# Patient Record
Sex: Male | Born: 1995 | Race: Black or African American | Hispanic: No | Marital: Single | Smoking: Current every day smoker
Health system: Southern US, Community
[De-identification: ages and names within clinical notes are randomized; demographics above are authoritative.]

---
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
## 2017-12-14 ENCOUNTER — Emergency Department (HOSPITAL_COMMUNITY)
Admission: EM | Admit: 2017-12-14 | Discharge: 2017-12-14 | Disposition: A | Discharge status: Home / Self Care | Payer: Medicaid Other | Attending: Emergency Medicine | Admitting: Emergency Medicine

## 2017-12-14 ENCOUNTER — Encounter (HOSPITAL_COMMUNITY): Payer: Self-pay

## 2017-12-14 ENCOUNTER — Emergency Department (HOSPITAL_COMMUNITY): Payer: Medicaid Other

## 2017-12-14 DIAGNOSIS — F17200 Nicotine dependence, unspecified, uncomplicated: Secondary | ICD-10-CM | POA: Insufficient documentation

## 2017-12-14 DIAGNOSIS — R072 Precordial pain: Secondary | ICD-10-CM | POA: Insufficient documentation

## 2017-12-14 DIAGNOSIS — K219 Gastro-esophageal reflux disease without esophagitis: Secondary | ICD-10-CM | POA: Insufficient documentation

## 2017-12-14 LAB — BASIC METABOLIC PANEL
ANION GAP: 10 (ref 5–15)
BUN: 14 mg/dL (ref 6–20)
CALCIUM: 9.1 mg/dL (ref 8.9–10.3)
CO2: 26 mmol/L (ref 22–32)
Chloride: 104 mmol/L (ref 101–111)
Creatinine, Ser: 1.21 mg/dL (ref 0.61–1.24)
GFR calc Af Amer: >60 mL/min (ref >60)
Glucose, Bld: 116 mg/dL — ABNORMAL HIGH (ref 65–99)
POTASSIUM: 3.5 mmol/L (ref 3.5–5.1)
SODIUM: 140 mmol/L (ref 135–145)

## 2017-12-14 LAB — CBC
HEMATOCRIT: 42.2 % (ref 39.0–52.0)
HEMOGLOBIN: 13 g/dL (ref 13.0–17.0)
MCH: 26.2 pg (ref 26.0–34.0)
MCHC: 30.8 g/dL (ref 30.0–36.0)
MCV: 84.9 fL (ref 78.0–100.0)
Platelets: 289 10*3/uL (ref 150–400)
RBC: 4.97 MIL/uL (ref 4.22–5.81)
RDW: 13.3 % (ref 11.5–15.5)
WBC: 8.3 10*3/uL (ref 4.0–10.5)

## 2017-12-14 LAB — I-STAT TROPONIN, ED: TROPONIN I, POC: 0 ng/mL (ref 0.00–0.08)

## 2017-12-14 MED ORDER — ALUM & MAG HYDROXIDE-SIMETH 200-200-20 MG/5ML PO SUSP
30.0000 mL | Freq: Once | ORAL | Status: AC
  Administered 2017-12-14: 30 mL via ORAL
  Filled 2017-12-14: qty 30

## 2017-12-14 NOTE — ED Notes (Signed)
Patient presents to ed c/o chest pain  Onset earlier tonight  Worse why lying down states pain is worse with inspiration , states he feels like he can't get a deep enough breath in. C/o occ. Cough non-productive.

## 2017-12-14 NOTE — ED Triage Notes (Signed)
Pt reports that CP has been on and off all night, feels like his heart is racing with SOB, denies n/v or radiation

## 2017-12-14 NOTE — ED Provider Notes (Signed)
[facility] EMERGENCY [facility]PARTMENT Provider Note   CSN: [id number] Arrival date & time: 12/14/17  0305     History   Chief Complaint Chief Complaint  Patient presents with  . Chest Pain    HPI Gregory Ross is a 22 y.o. male.  HPI Patient is a healthy 22 year old male presents the emergency department after developing some anterior chest discomfort tonight after laying flat to go to bed.  He denies shortness of breath.  No fevers or chills.  No history of DVT or pulmonary embolism.  No family history of venous thromboembolic disease.  His pain is constant and is worse with deep breathing and laying flat.  Symptoms are mild in severity.  No medications attempted prior to arrival.  Just prior to lying down he did eat pizza and spicy blue cheese wings   History reviewed. No pertinent past medical history.  There are no active problems to display for this patient.   History reviewed. No pertinent surgical history.      Home Medications    Prior to Admission medications   Not on File    Family History No family history on file.  Social History Social History   Tobacco Use  . Smoking status: Current Every Day Smoker  . Smokeless tobacco: Never Used  Substance Use Topics  . Alcohol use: Yes  . Drug use: No     Allergies   Patient has no known allergies.   Review of Systems Review of Systems  All other systems reviewed and are negative.    Physical Exam Updated Vital Signs BP 121/70 (BP Location: Right Arm)   Pulse 70   Temp 98.2 F (36.8 C) (Oral)   Resp 16   Ht 5\' 6"  (1.676 m)   Wt 72.6 kg (160 lb)   SpO2 99%   BMI 25.82 kg/m   Physical Exam  Constitutional: He is oriented to person, place, and time.  HENT:  Head: Normocephalic and atraumatic.  Eyes: EOM are normal.  Neck: Normal range of motion.  Cardiovascular: Normal rate, regular rhythm and intact distal pulses.  Pulmonary/Chest: Effort normal and breath sounds normal.  No respiratory distress.  Abdominal: He exhibits no distension. There is no tenderness.  Musculoskeletal: Normal range of motion.  Neurological: He is alert and oriented to person, place, and time.  Skin: Skin is warm and dry.  Nursing note and vitals reviewed.    ED Treatments / Results  Labs (all labs ordered are listed, but only abnormal results are displayed) Labs Reviewed  BASIC METABOLIC PANEL - Abnormal; Notable for the following components:      Result Value   Glucose, Bld 116 (*)    All other components within normal limits  CBC  I-STAT TROPONIN, ED    EKG EKG Interpretation  Date/Time:  Monday December 14 2017 03:11:34 EDT Ventricular Rate:  72 PR Interval:  160 QRS Duration: 92 QT Interval:  362 QTC Calculation: 396 R Axis:   87 Text Interpretation:  Normal sinus rhythm Normal ECG No old tracing to compare Confirmed by Gregory Bilisampos, Gregory Ross ([id number]) on 12/14/2017 3:39:56 AM   Radiology Dg Chest 2 View  Result Date: 12/14/2017 CLINICAL DATA:  22 year old male with chest pain. EXAM: CHEST - 2 VIEW COMPARISON:  None. FINDINGS: The heart size and mediastinal contours are within normal limits. Both lungs are clear. The visualized skeletal structures are unremarkable. IMPRESSION: No active cardiopulmonary disease. Electronically Signed   By: Ceasar MonsArash  Radparvar M.D.  On: 12/14/2017 03:46    Procedures Procedures (including critical care time)  Medications Ordered in ED Medications  alum & mag hydroxide-simeth (MAALOX/MYLANTA) 200-200-20 MG/5ML suspension 30 mL (30 mLs Oral Given 12/14/17 0428)     Initial Impression / Assessment and Plan / ED Course  I have reviewed the triage vital signs and the nursing notes.  Pertinent labs & imaging results that were available during my care of the patient were reviewed by me and considered in my medical decision making (see chart for details).     Doubt acute coronary syndrome.  EKG without ischemic changes.  Chest x-ray normal.   Patient is PERC negative.  Improved with Maalox.  Suspect gastroesophageal reflux.  Primary care follow-up.  Patient understands return to the ER for new or worsening symptoms  Final Clinical Impressions(s) / ED Diagnoses   Final diagnoses:  Precordial chest pain  Gastroesophageal reflux disease without esophagitis    ED Discharge Orders    None       Gregory Bilis, MD 12/14/17 [phone]

## 2018-11-21 ENCOUNTER — Encounter (HOSPITAL_COMMUNITY): Payer: Self-pay | Admitting: Emergency Medicine

## 2018-11-21 ENCOUNTER — Emergency Department (HOSPITAL_COMMUNITY)
Admission: EM | Admit: 2018-11-21 | Discharge: 2018-11-22 | Disposition: A | Discharge status: Home / Self Care | Payer: Self-pay | Attending: Emergency Medicine | Admitting: Emergency Medicine

## 2018-11-21 DIAGNOSIS — Z23 Encounter for immunization: Secondary | ICD-10-CM | POA: Insufficient documentation

## 2018-11-21 DIAGNOSIS — F17200 Nicotine dependence, unspecified, uncomplicated: Secondary | ICD-10-CM | POA: Insufficient documentation

## 2018-11-21 DIAGNOSIS — W25XXXA Contact with sharp glass, initial encounter: Secondary | ICD-10-CM | POA: Insufficient documentation

## 2018-11-21 DIAGNOSIS — Y929 Unspecified place or not applicable: Secondary | ICD-10-CM | POA: Insufficient documentation

## 2018-11-21 DIAGNOSIS — Y939 Activity, unspecified: Secondary | ICD-10-CM | POA: Insufficient documentation

## 2018-11-21 DIAGNOSIS — S61412A Laceration without foreign body of left hand, initial encounter: Secondary | ICD-10-CM | POA: Insufficient documentation

## 2018-11-21 DIAGNOSIS — Y999 Unspecified external cause status: Secondary | ICD-10-CM | POA: Insufficient documentation

## 2018-11-21 NOTE — ED Triage Notes (Signed)
Pt just cut left hand on glass- pt has wrapped in gauze with bleeding controlled.

## 2018-11-22 ENCOUNTER — Emergency Department (HOSPITAL_COMMUNITY): Payer: Self-pay

## 2018-11-22 MED ORDER — OXYCODONE-ACETAMINOPHEN 5-325 MG PO TABS
1.0000 | ORAL_TABLET | Freq: Once | ORAL | Status: AC
  Administered 2018-11-22: 1 via ORAL
  Filled 2018-11-22: qty 1

## 2018-11-22 MED ORDER — TETANUS-DIPHTH-ACELL PERTUSSIS 5-2.5-18.5 LF-MCG/0.5 IM SUSP
0.5000 mL | Freq: Once | INTRAMUSCULAR | Status: AC
  Administered 2018-11-22: 0.5 mL via INTRAMUSCULAR
  Filled 2018-11-22: qty 0.5

## 2018-11-22 MED ORDER — LIDOCAINE HCL 2 % IJ SOLN
10.0000 mL | Freq: Once | INTRAMUSCULAR | Status: AC
  Administered 2018-11-22: 200 mg
  Filled 2018-11-22: qty 20

## 2018-11-22 NOTE — ED Notes (Signed)
2 inch laceration to the lateral surface of the pts lt hand  Minimal bleeding  He cut it on a piece of [place] 0000 [place]  Laceration soaked in betadine solution

## 2018-11-22 NOTE — Discharge Instructions (Addendum)
Thank you for allowing me to care for you today in the Emergency Department.   Your sutures need to be removed in approximately 7 days.  You can return to the ER, go to urgent care, or follow-up with primary care to have your sutures removed.  To care for your wound at home, keep the area clean and dry.  Clean the area at least once daily with warm water and soap.  Then, apply a topical antibiotic such as bacitracin or Neosporin to the wound.  Keep the wound covered to avoid getting it contaminated.  Make sure to keep the splint on your finger to avoid bending your finger and ripping out the stitches.  For pain control, you can take 600 mg of ibuprofen with food or 6 and 50 mg of Tylenol once every 6 hours for pain control or alternate between these 2 medications every 3 hours.  Apply ice for 15-20 presents as frequently as needed help with pain and swelling.  Try to keep the left hand elevated at or above the level of your heart to help with swelling.  Your tetanus immunization was updated today.  You should return to the emergency department if you develop fever, chills, or if the wound becomes red, hot, swollen, or if you start to have thick, mucus-like drainage from the wound.

## 2018-11-22 NOTE — ED Provider Notes (Signed)
[facility] EMERGENCY DEPARTMENT Provider Note   CSN: [id number] Arrival date & time: 11/21/18  2341    History   Chief Complaint Chief Complaint  Patient presents with  . Extremity Laceration    HPI Gregory PFLUGER is a 23 y.o. male who presents to the emergency department today with a chief complaint of left hand laceration.  The patient reports that he cut his left hand on glass around 2300.  He reports that he dressed the wound prior to arrival, no other treatment prior to arrival.  He reports severe pain to the left hand.  It is worse with movement.  He denies numbness, weakness, fever, chills, or pain in the fingers or the left wrist.  Wound is currently hemostatic.  He is right-hand dominant.  He is a current, every day smoker.  He is unsure of when his tetanus was last updated.     The history is provided by the patient. No language interpreter was used.    History reviewed. No pertinent past medical history.  There are no active problems to display for this patient.   History reviewed. No pertinent surgical history.      Home Medications    Prior to Admission medications   Not on File    Family History No family history on file.  Social History Social History   Tobacco Use  . Smoking status: Current Every Day Smoker  . Smokeless tobacco: Never Used  Substance Use Topics  . Alcohol use: Yes  . Drug use: No     Allergies   Patient has no known allergies.   Review of Systems Review of Systems  Constitutional: Negative for activity change, chills and fever.  Respiratory: Negative for shortness of breath.   Cardiovascular: Negative for chest pain.  Gastrointestinal: Negative for abdominal pain and vomiting.  Musculoskeletal: Negative for back pain.  Skin: Positive for wound. Negative for color change and rash.  Neurological: Negative for dizziness, weakness and numbness.     Physical Exam Updated Vital Signs BP 120/74    Pulse 82   Temp 98.4 F (36.9 C) (Oral)   Resp 16   SpO2 100%   Physical Exam Vitals signs and nursing note reviewed.  Constitutional:      Appearance: He is well-developed.  HENT:     Head: Normocephalic.  Eyes:     Conjunctiva/sclera: Conjunctivae normal.  Neck:     Musculoskeletal: Neck supple.  Cardiovascular:     Rate and Rhythm: Normal rate and regular rhythm.     Heart sounds: No murmur.  Pulmonary:     Effort: Pulmonary effort is normal.  Abdominal:     General: There is no distension.     Palpations: Abdomen is soft.  Skin:    General: Skin is warm and dry.  Neurological:     Mental Status: He is alert.  Psychiatric:        Behavior: Behavior normal.        ED Treatments / Results  Labs (all labs ordered are listed, but only abnormal results are displayed) Labs Reviewed - No data to display  EKG None  Radiology Dg Hand Complete Left  Result Date: 11/22/2018 CLINICAL DATA:  23 year old male status post glass laceration to the 5th metacarpal region. EXAM: LEFT HAND - COMPLETE 3+ VIEW COMPARISON:  None. FINDINGS: Soft tissue swelling and small volume soft tissue gas along the hypothenar eminence. No radiopaque foreign body identified. Underlying normal bone mineralization. No osseous  abnormality identified. IMPRESSION: Soft tissue injury with small volume of soft tissue gas. No osseous injury or radiopaque foreign body identified. Electronically Signed   By: Odessa Fleming M.D.   On: 11/22/2018 04:34    Procedures .Marland KitchenLaceration Repair Date/Time: 11/22/2018 5:48 AM Performed by: Barkley Boards, PA-C Authorized by: Barkley Boards, PA-C   Consent:    Consent obtained:  Verbal   Consent given by:  Patient   Risks discussed:  Infection, pain, retained foreign body, poor cosmetic result, poor wound healing and nerve damage   Alternatives discussed:  No treatment Anesthesia (see MAR for exact dosages):    Anesthesia method:  Local infiltration   Local anesthetic:   Lidocaine 2% w/o epi Laceration details:    Location:  Hand   Hand location: ulnar aspect of the left hand    Length (cm):  3 Repair type:    Repair type:  Intermediate Pre-procedure details:    Preparation:  Patient was prepped and draped in usual sterile fashion and imaging obtained to evaluate for foreign bodies Exploration:    Hemostasis achieved with:  Direct pressure   Wound exploration: wound explored through full range of motion and entire depth of wound probed and visualized     Wound extent: no fascia violation noted, no foreign bodies/material noted, no muscle damage noted, no nerve damage noted, no tendon damage noted, no underlying fracture noted and no vascular damage noted     Contaminated: no   Treatment:    Area cleansed with:  Betadine and saline   Amount of cleaning:  Extensive   Irrigation method:  Pressure wash   Visualized foreign bodies/material removed: no   Skin repair:    Repair method:  Sutures   Suture size: 3-0 Vicryl Rapide and 4-0 Prolene.   Wound skin closure material used: Prolene and Vicryl Rapide.   Suture technique: Buried and horizontal mattress.   Number of sutures:  7 Approximation:    Approximation:  Close Post-procedure details:    Dressing:  Antibiotic ointment, splint for protection and sterile dressing   Patient tolerance of procedure:  Tolerated well, no immediate complications Comments:     Deep laceration to the ulnar aspect of the left hand.  No tendon involvement.  Wound was copiously irrigated.  No foreign bodies were visualized.  Four buried sutures were used to close the subcutaneous layer and 3 horizontal mattress sutures were used to close the epidermis.   (including critical care time)  Medications Ordered in ED Medications  oxyCODONE-acetaminophen (PERCOCET/ROXICET) 5-325 MG per tablet 1 tablet (1 tablet Oral Given 11/22/18 0500)  Tdap (BOOSTRIX) injection 0.5 mL (0.5 mLs Intramuscular Given 11/22/18 0501)  lidocaine  (XYLOCAINE) 2 % (with pres) injection 200 mg (200 mg Infiltration Given 11/22/18 0501)     Initial Impression / Assessment and Plan / ED Course  I have reviewed the triage vital signs and the nursing notes.  Pertinent labs & imaging results that were available during my care of the patient were reviewed by me and considered in my medical decision making (see chart for details).        X-ray of the left hand is negative for fracture.  There is a soft tissue injury with a small volume of soft tissue gas, but this would be expected given the location of the laceration.  Pressure irrigation performed. Wound explored and base of wound visualized in a bloodless field without evidence of foreign body.  Laceration occurred < 8 hours prior to  repair which was well tolerated. Tdap updated.  Pt has no comorbidities to effect normal wound healing aside from smoking cigarettes. Pt discharged without antibiotics.  Discussed suture home care with patient and answered questions. Pt to follow-up for wound check and suture removal in 7 days; they are to return to the ED sooner for signs of infection.  Splint given for protection and a sterile dressing was in place prior to discharge.  Pt is hemodynamically stable with no complaints prior to dc.    Final Clinical Impressions(s) / ED Diagnoses   Final diagnoses:  Laceration of left hand without foreign body, initial encounter    ED Discharge Orders    None       Barkley BoardsMcDonald, Micole Delehanty A, PA-C 11/22/18 0829    Zadie RhineWickline, Donald, MD 11/23/18 [phone]

## 2018-11-22 NOTE — ED Notes (Signed)
Patient transported to X-ray 

## 2018-11-29 ENCOUNTER — Other Ambulatory Visit: Payer: Self-pay

## 2018-11-29 ENCOUNTER — Emergency Department (HOSPITAL_COMMUNITY)
Admission: EM | Admit: 2018-11-29 | Discharge: 2018-11-29 | Disposition: A | Discharge status: Home / Self Care | Payer: Medicaid Other | Attending: Emergency Medicine | Admitting: Emergency Medicine

## 2018-11-29 ENCOUNTER — Encounter (HOSPITAL_COMMUNITY): Payer: Self-pay | Admitting: *Deleted

## 2018-11-29 DIAGNOSIS — Z4802 Encounter for removal of sutures: Secondary | ICD-10-CM

## 2018-11-29 NOTE — ED Triage Notes (Signed)
Patient here for suture removal left hand. No signs of infection

## 2018-11-29 NOTE — ED Notes (Signed)
Patient verbalizes understanding of discharge instructions. Opportunity for questioning and answers were provided. Armband removed by staff, pt discharged from ED.  

## 2018-11-29 NOTE — ED Provider Notes (Signed)
[facility] EMERGENCY DEPARTMENT Provider Note   CSN: [id number] Arrival date & time: 11/29/18  1423    History   Chief Complaint Chief Complaint  Patient presents with  . Suture / Staple Removal    HPI RONAL MAYBURY is a 23 y.o. male.     HPI   23 year old male presents today for suture removal.  Patient cut his left hand on a piece of glass on 11/21/2018.  He had 3 sutures placed.  He notes since that time wound has been healing well.  No signs of infection including surrounding redness or fever.  Some minor pain around the soft tissue.  Intact sensation and range of motion of his pinky.  History reviewed. No pertinent past medical history.  There are no active problems to display for this patient.   History reviewed. No pertinent surgical history.      Home Medications    Prior to Admission medications   Not on File    Family History No family history on file.  Social History Social History   Tobacco Use  . Smoking status: Current Every Day Smoker  . Smokeless tobacco: Never Used  Substance Use Topics  . Alcohol use: Yes  . Drug use: No     Allergies   Patient has no known allergies.   Review of Systems Review of Systems  All other systems reviewed and are negative.    Physical Exam Updated Vital Signs BP 132/82 (BP Location: Right Arm)   Temp 98.2 F (36.8 C) (Oral)   Resp 16   SpO2 98%   Physical Exam Vitals signs and nursing note reviewed.  Constitutional:      Appearance: He is well-developed.  HENT:     Head: Normocephalic and atraumatic.  Eyes:     General: No scleral icterus.       Right eye: No discharge.        Left eye: No discharge.     Conjunctiva/sclera: Conjunctivae normal.     Pupils: Pupils are equal, round, and reactive to light.  Neck:     Musculoskeletal: Normal range of motion.     Vascular: No JVD.     Trachea: No tracheal deviation.  Pulmonary:     Effort: Pulmonary effort is normal.     Breath sounds: No stridor.  Musculoskeletal:     Comments: 3 cm laceration to the left ulnar hand-3 stitches in place, no signs of surrounding redness,, no warmth to touch, nontender sensation intact to the hand and finger, range of motion intact at the pinky  Neurological:     Mental Status: He is alert and oriented to person, place, and time.     Coordination: Coordination normal.  Psychiatric:        Behavior: Behavior normal.        Thought Content: Thought content normal.        Judgment: Judgment normal.      ED Treatments / Results  Labs (all labs ordered are listed, but only abnormal results are displayed) Labs Reviewed - No data to display  EKG None  Radiology No results found.  Procedures .Suture Removal Date/Time: 11/29/2018 3:51 PM Performed by: Okey Regal, PA-C Authorized by: Okey Regal, PA-C   Consent:    Consent obtained:  Verbal   Consent given by:  Patient   Risks discussed:  Bleeding, pain and wound separation   Alternatives discussed:  No treatment Procedure details:    Wound appearance:  No  signs of infection, good wound healing, clean and moist   Number of sutures removed:  3 Post-procedure details:    Post-removal:  No dressing applied   Patient tolerance of procedure:  Tolerated well, no immediate complications   (including critical care time)  Medications Ordered in ED Medications - No data to display   Initial Impression / Assessment and Plan / ED Course  I have reviewed the triage vital signs and the nursing notes.  Pertinent labs & imaging results that were available during my care of the patient were reviewed by me and considered in my medical decision making (see chart for details).         Assessment/Plan: Patient here for suture removal, no complications noted.  Discharged with return precautions.    Final Clinical Impressions(s) / ED Diagnoses   Final diagnoses:  Visit for suture removal    ED Discharge  Orders    None       Eyvonne MechanicHedges, Mckinnley Smithey, PA-C 11/29/18 1552    Margarita Grizzleay, Danielle, MD 11/30/18 [phone]

## 2018-11-29 NOTE — Discharge Instructions (Addendum)
Please read attached information. If you experience any new or worsening signs or symptoms please return to the emergency room for evaluation. P

## 2019-10-12 ENCOUNTER — Ambulatory Visit: Payer: Medicaid Other | Attending: Internal Medicine

## 2019-10-12 DIAGNOSIS — Z20822 Contact with and (suspected) exposure to covid-19: Secondary | ICD-10-CM

## 2019-10-13 LAB — NOVEL CORONAVIRUS, NAA: SARS-CoV-2, NAA: NOT DETECTED

## 2019-10-13 LAB — SARS-COV-2, NAA 2 DAY TAT

## 2021-03-20 IMAGING — DX LEFT HAND - COMPLETE 3+ VIEW
3 series · 3 of 3 positions shown · non-contrast
Comparison: None.

CLINICAL DATA: 22-year-old male status post glass laceration to the
5th metacarpal region.

EXAM:
LEFT HAND - COMPLETE 3+ VIEW

[hand pa]
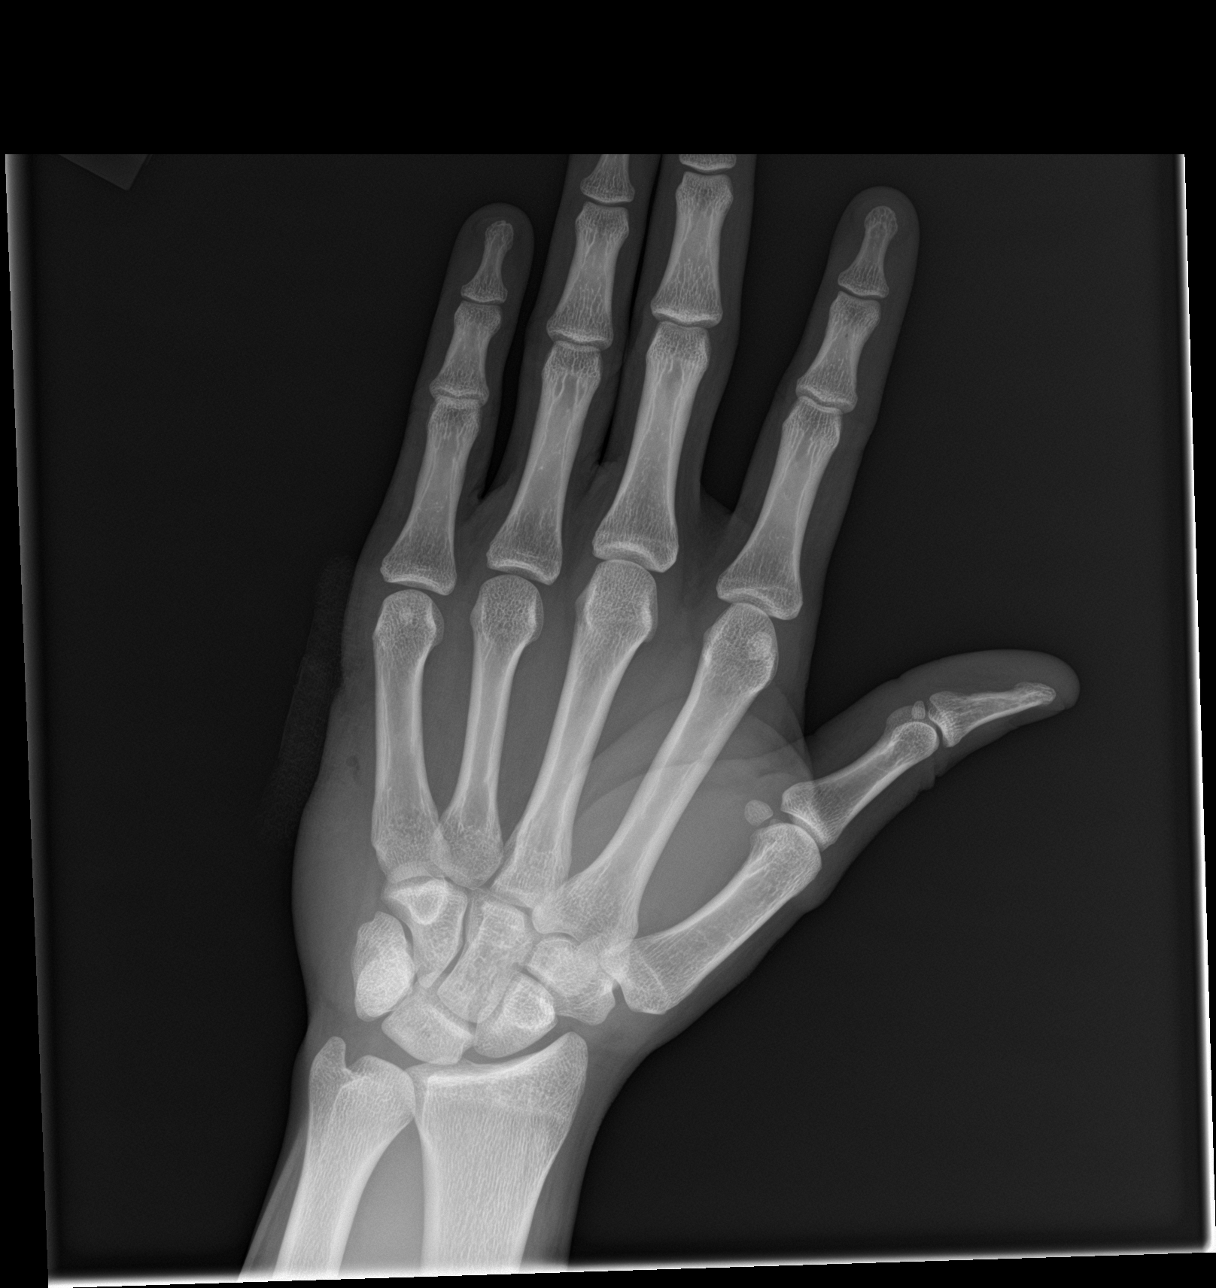

[hand obl]
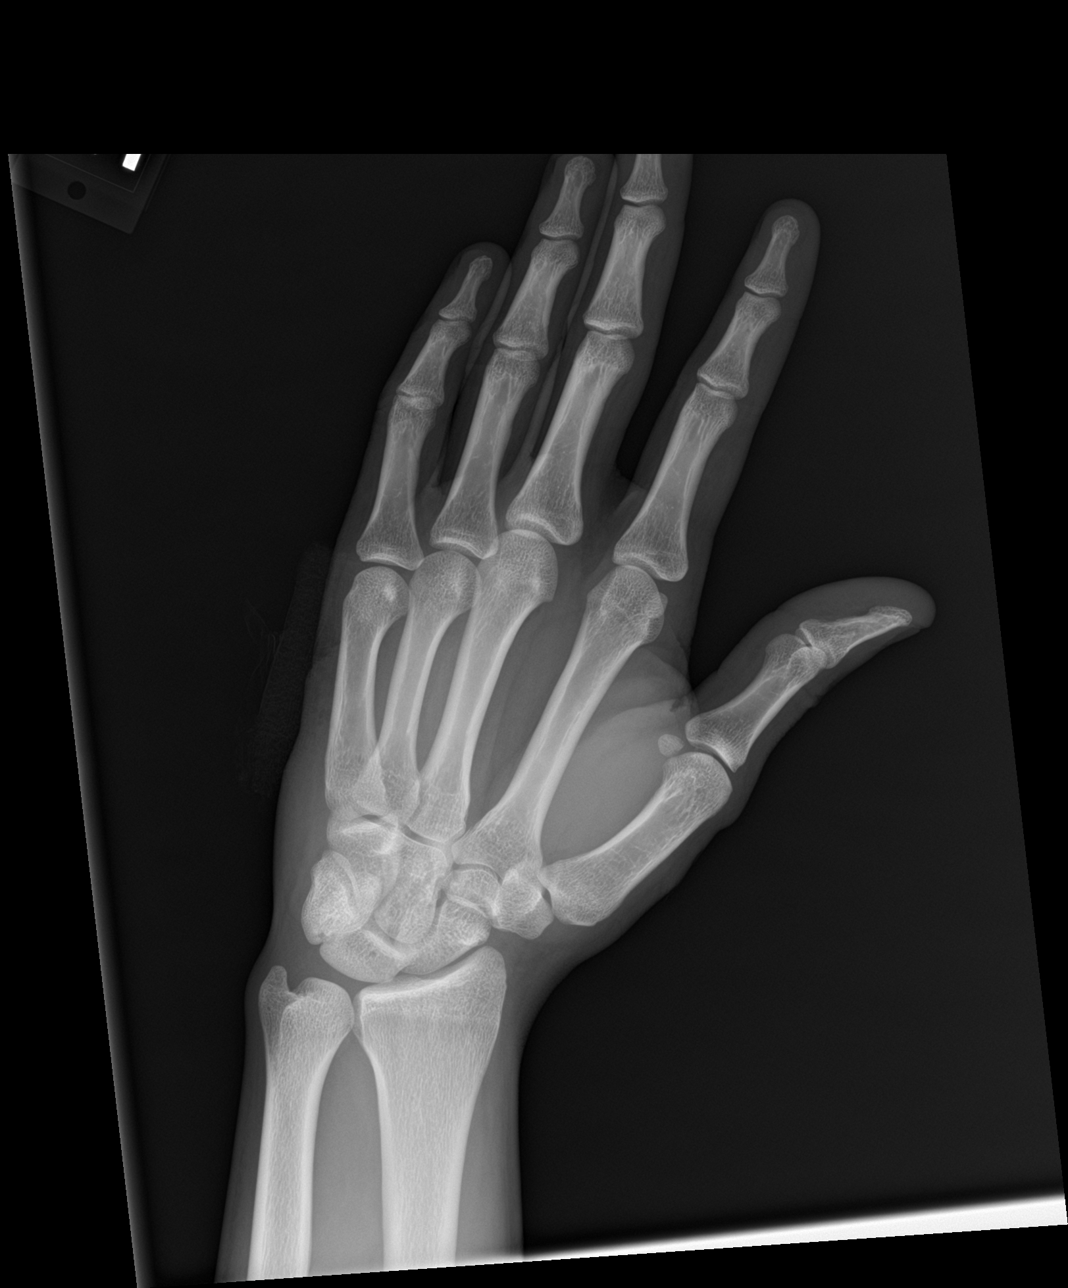

[hand lat]
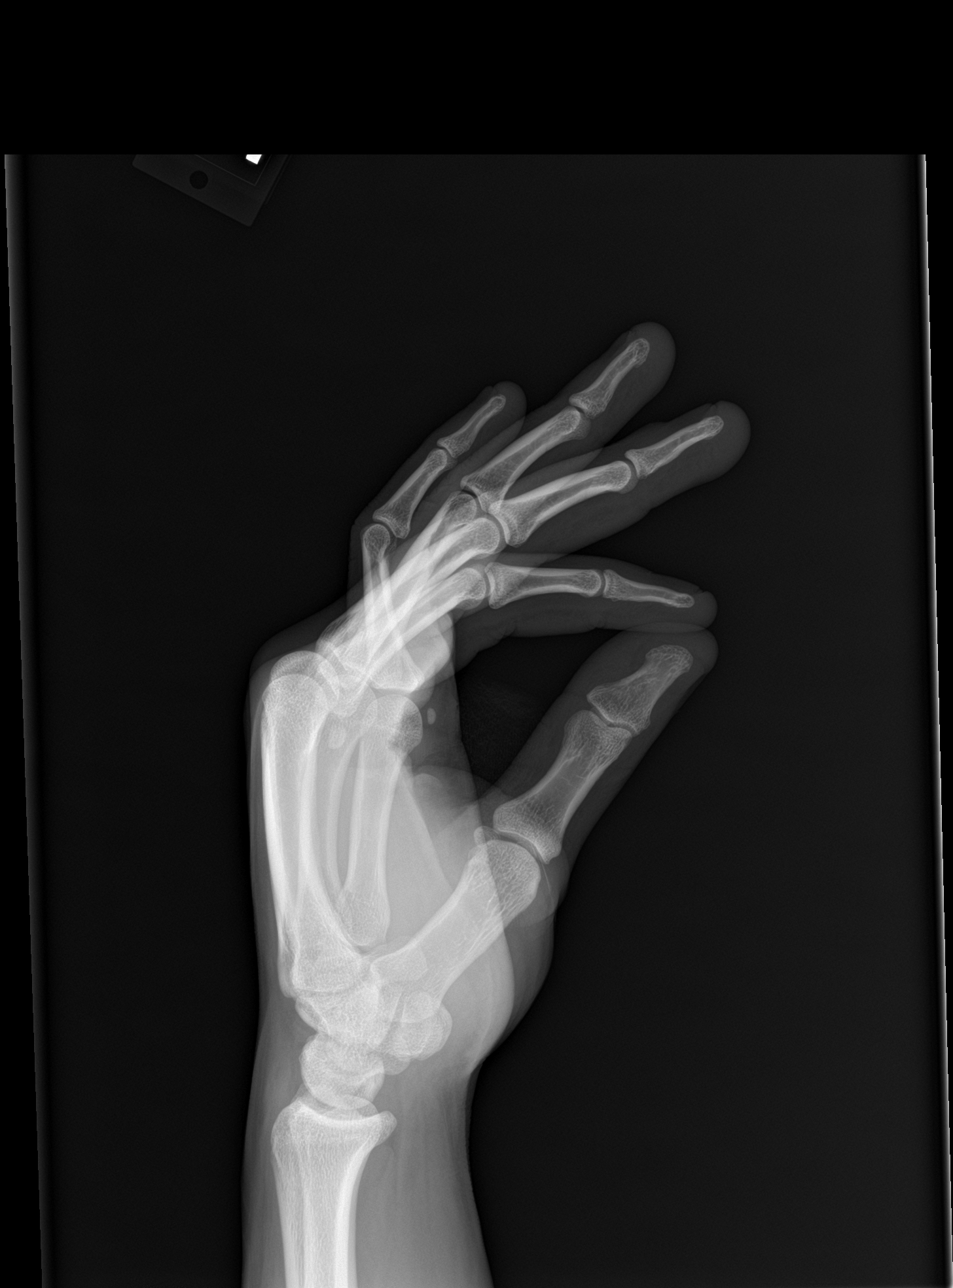

[3 of 3 positions shown; findings below may reference images not displayed]

FINDINGS: Soft tissue swelling and small volume soft tissue gas along the
hypothenar eminence. No radiopaque foreign body identified.

Underlying normal bone mineralization. No osseous abnormality
identified.
IMPRESSION: Soft tissue injury with small volume of soft tissue gas. No osseous
injury or radiopaque foreign body identified.

## 2022-07-31 ENCOUNTER — Telehealth: Payer: Self-pay

## 2022-07-31 NOTE — Telephone Encounter (Signed)
Mychart msg sent. AS, CMA 

## 2022-08-31 DIAGNOSIS — H5213 Myopia, bilateral: Secondary | ICD-10-CM | POA: Diagnosis not present
